# Patient Record
Sex: Male | Born: 1995 | Race: Black or African American | Hispanic: No | Marital: Single | Smoking: Never smoker
Health system: Southern US, Community
[De-identification: ages and names within clinical notes are randomized; demographics above are authoritative.]

## PROBLEM LIST (undated history)

## (undated) ENCOUNTER — Ambulatory Visit: Payer: Self-pay

---

## 2017-08-19 ENCOUNTER — Emergency Department (HOSPITAL_COMMUNITY): Payer: Worker's Compensation

## 2017-08-19 ENCOUNTER — Emergency Department (HOSPITAL_COMMUNITY)
Admission: EM | Admit: 2017-08-19 | Discharge: 2017-08-19 | Disposition: A | Discharge status: Home / Self Care | Payer: Worker's Compensation | Attending: Emergency Medicine | Admitting: Emergency Medicine

## 2017-08-19 ENCOUNTER — Other Ambulatory Visit: Payer: Self-pay

## 2017-08-19 DIAGNOSIS — S0990XA Unspecified injury of head, initial encounter: Secondary | ICD-10-CM | POA: Diagnosis not present

## 2017-08-19 DIAGNOSIS — M549 Dorsalgia, unspecified: Secondary | ICD-10-CM | POA: Insufficient documentation

## 2017-08-19 DIAGNOSIS — M545 Low back pain: Secondary | ICD-10-CM | POA: Diagnosis not present

## 2017-08-19 DIAGNOSIS — S199XXA Unspecified injury of neck, initial encounter: Secondary | ICD-10-CM | POA: Diagnosis not present

## 2017-08-19 DIAGNOSIS — Y998 Other external cause status: Secondary | ICD-10-CM | POA: Diagnosis not present

## 2017-08-19 DIAGNOSIS — Y9389 Activity, other specified: Secondary | ICD-10-CM | POA: Insufficient documentation

## 2017-08-19 DIAGNOSIS — S161XXA Strain of muscle, fascia and tendon at neck level, initial encounter: Secondary | ICD-10-CM | POA: Insufficient documentation

## 2017-08-19 DIAGNOSIS — G44209 Tension-type headache, unspecified, not intractable: Secondary | ICD-10-CM

## 2017-08-19 DIAGNOSIS — R079 Chest pain, unspecified: Secondary | ICD-10-CM | POA: Diagnosis not present

## 2017-08-19 DIAGNOSIS — S299XXA Unspecified injury of thorax, initial encounter: Secondary | ICD-10-CM | POA: Diagnosis not present

## 2017-08-19 DIAGNOSIS — S3992XA Unspecified injury of lower back, initial encounter: Secondary | ICD-10-CM | POA: Diagnosis not present

## 2017-08-19 DIAGNOSIS — Y92410 Unspecified street and highway as the place of occurrence of the external cause: Secondary | ICD-10-CM | POA: Insufficient documentation

## 2017-08-19 DIAGNOSIS — M546 Pain in thoracic spine: Secondary | ICD-10-CM | POA: Diagnosis not present

## 2017-08-19 DIAGNOSIS — R0781 Pleurodynia: Secondary | ICD-10-CM | POA: Diagnosis not present

## 2017-08-19 DIAGNOSIS — R51 Headache: Secondary | ICD-10-CM | POA: Diagnosis not present

## 2017-08-19 DIAGNOSIS — R001 Bradycardia, unspecified: Secondary | ICD-10-CM | POA: Diagnosis not present

## 2017-08-19 MED ORDER — NAPROXEN 500 MG PO TABS: 500.0000 mg | ORAL_TABLET | Freq: Two times a day (BID) | ORAL | 0 refills | Status: AC

## 2017-08-19 MED ORDER — METHOCARBAMOL 500 MG PO TABS: 500.0000 mg | ORAL_TABLET | Freq: Two times a day (BID) | ORAL | 0 refills | Status: DC

## 2017-08-19 NOTE — ED Notes (Signed)
ED Provider at bedside. 

## 2017-08-19 NOTE — Discharge Instructions (Signed)
Your work-up in the emergency department today was reassuring.  Alternate ice and heat to areas of injury 3-4 times per day to limit inflammation and spasm.  Avoid strenuous activity and heavy lifting.  We recommend consistent use of naproxen in addition to Robaxin for muscle spasms.  Do not drive or drink alcohol after taking Robaxin as it may make you drowsy and impair your judgment.  We recommend follow-up with a primary care doctor to ensure resolution of symptoms.  Return to the ED for any new or concerning symptoms.
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
## 2017-08-21 ENCOUNTER — Emergency Department (HOSPITAL_COMMUNITY)
Admission: EM | Admit: 2017-08-21 | Discharge: 2017-08-21 | Disposition: A | Discharge status: Home / Self Care | Payer: BLUE CROSS/BLUE SHIELD | Attending: Emergency Medicine | Admitting: Emergency Medicine

## 2017-08-21 ENCOUNTER — Encounter (HOSPITAL_COMMUNITY): Payer: Self-pay | Admitting: *Deleted

## 2017-08-21 ENCOUNTER — Other Ambulatory Visit: Payer: Self-pay

## 2017-08-21 DIAGNOSIS — Z041 Encounter for examination and observation following transport accident: Secondary | ICD-10-CM | POA: Insufficient documentation

## 2017-08-21 DIAGNOSIS — M545 Low back pain: Secondary | ICD-10-CM | POA: Diagnosis not present

## 2017-08-21 NOTE — ED Notes (Signed)
Declined W/C at D/C and was escorted to lobby by RN. 
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
## 2017-08-21 NOTE — Discharge Instructions (Addendum)
Use heat/ice or IcyHot on your back for comfort.  Avoid taking Naproxen as it may have upset your stomach.  Take tylenol instead.

## 2018-02-27 DIAGNOSIS — E559 Vitamin D deficiency, unspecified: Secondary | ICD-10-CM | POA: Diagnosis not present

## 2018-02-27 DIAGNOSIS — R202 Paresthesia of skin: Secondary | ICD-10-CM | POA: Diagnosis not present

## 2018-02-27 DIAGNOSIS — E079 Disorder of thyroid, unspecified: Secondary | ICD-10-CM | POA: Diagnosis not present

## 2018-02-27 DIAGNOSIS — E785 Hyperlipidemia, unspecified: Secondary | ICD-10-CM | POA: Diagnosis not present

## 2018-02-27 DIAGNOSIS — Z681 Body mass index (BMI) 19 or less, adult: Secondary | ICD-10-CM | POA: Diagnosis not present

## 2018-02-27 DIAGNOSIS — D649 Anemia, unspecified: Secondary | ICD-10-CM | POA: Diagnosis not present

## 2018-02-27 DIAGNOSIS — J309 Allergic rhinitis, unspecified: Secondary | ICD-10-CM | POA: Diagnosis not present

## 2018-03-05 DIAGNOSIS — E559 Vitamin D deficiency, unspecified: Secondary | ICD-10-CM | POA: Diagnosis not present

## 2018-09-30 DIAGNOSIS — R112 Nausea with vomiting, unspecified: Secondary | ICD-10-CM | POA: Diagnosis not present

## 2018-10-07 DIAGNOSIS — Z202 Contact with and (suspected) exposure to infections with a predominantly sexual mode of transmission: Secondary | ICD-10-CM | POA: Diagnosis not present

## 2018-11-05 DIAGNOSIS — J019 Acute sinusitis, unspecified: Secondary | ICD-10-CM | POA: Diagnosis not present

## 2019-07-05 DIAGNOSIS — Z20828 Contact with and (suspected) exposure to other viral communicable diseases: Secondary | ICD-10-CM | POA: Diagnosis not present

## 2019-07-05 DIAGNOSIS — Z03818 Encounter for observation for suspected exposure to other biological agents ruled out: Secondary | ICD-10-CM | POA: Diagnosis not present

## 2020-01-14 DIAGNOSIS — R52 Pain, unspecified: Secondary | ICD-10-CM | POA: Diagnosis not present

## 2020-01-14 DIAGNOSIS — U071 COVID-19: Secondary | ICD-10-CM | POA: Diagnosis not present

## 2020-01-14 DIAGNOSIS — J029 Acute pharyngitis, unspecified: Secondary | ICD-10-CM | POA: Diagnosis not present

## 2020-01-14 DIAGNOSIS — R509 Fever, unspecified: Secondary | ICD-10-CM | POA: Diagnosis not present

## 2020-01-14 DIAGNOSIS — R059 Cough, unspecified: Secondary | ICD-10-CM | POA: Diagnosis not present

## 2020-01-23 DIAGNOSIS — U071 COVID-19: Secondary | ICD-10-CM | POA: Diagnosis not present

## 2020-01-23 DIAGNOSIS — Z20822 Contact with and (suspected) exposure to covid-19: Secondary | ICD-10-CM | POA: Diagnosis not present

## 2020-01-25 DIAGNOSIS — Z03818 Encounter for observation for suspected exposure to other biological agents ruled out: Secondary | ICD-10-CM | POA: Diagnosis not present

## 2020-01-25 DIAGNOSIS — Z20822 Contact with and (suspected) exposure to covid-19: Secondary | ICD-10-CM | POA: Diagnosis not present
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
## 2020-04-11 DIAGNOSIS — Z20822 Contact with and (suspected) exposure to covid-19: Secondary | ICD-10-CM | POA: Diagnosis not present

## 2020-04-11 DIAGNOSIS — Z03818 Encounter for observation for suspected exposure to other biological agents ruled out: Secondary | ICD-10-CM | POA: Diagnosis not present

## 2020-04-14 DIAGNOSIS — M791 Myalgia, unspecified site: Secondary | ICD-10-CM | POA: Diagnosis not present

## 2020-04-14 DIAGNOSIS — Z20822 Contact with and (suspected) exposure to covid-19: Secondary | ICD-10-CM | POA: Diagnosis not present

## 2020-04-14 DIAGNOSIS — R5383 Other fatigue: Secondary | ICD-10-CM | POA: Diagnosis not present

## 2020-04-14 DIAGNOSIS — R519 Headache, unspecified: Secondary | ICD-10-CM | POA: Diagnosis not present

## 2020-04-14 DIAGNOSIS — B349 Viral infection, unspecified: Secondary | ICD-10-CM | POA: Diagnosis not present

## 2020-06-05 DIAGNOSIS — J029 Acute pharyngitis, unspecified: Secondary | ICD-10-CM | POA: Diagnosis not present

## 2020-07-28 DIAGNOSIS — Z20822 Contact with and (suspected) exposure to covid-19: Secondary | ICD-10-CM | POA: Diagnosis not present

## 2020-07-28 DIAGNOSIS — R52 Pain, unspecified: Secondary | ICD-10-CM | POA: Diagnosis not present

## 2020-07-28 DIAGNOSIS — R509 Fever, unspecified: Secondary | ICD-10-CM | POA: Diagnosis not present

## 2022-04-09 ENCOUNTER — Encounter (HOSPITAL_BASED_OUTPATIENT_CLINIC_OR_DEPARTMENT_OTHER): Payer: Self-pay

## 2022-04-09 ENCOUNTER — Emergency Department (HOSPITAL_BASED_OUTPATIENT_CLINIC_OR_DEPARTMENT_OTHER)
Admission: EM | Admit: 2022-04-09 | Discharge: 2022-04-10 | Disposition: A | Discharge status: Home / Self Care | Payer: BLUE CROSS/BLUE SHIELD | Attending: Emergency Medicine | Admitting: Emergency Medicine

## 2022-04-09 DIAGNOSIS — Z20822 Contact with and (suspected) exposure to covid-19: Secondary | ICD-10-CM | POA: Insufficient documentation

## 2022-04-09 DIAGNOSIS — J101 Influenza due to other identified influenza virus with other respiratory manifestations: Secondary | ICD-10-CM | POA: Insufficient documentation

## 2022-04-09 LAB — RESP PANEL BY RT-PCR (RSV, FLU A&B, COVID)  RVPGX2
Influenza A by PCR: POSITIVE — AB
Influenza B by PCR: NEGATIVE
Resp Syncytial Virus by PCR: NEGATIVE
SARS Coronavirus 2 by RT PCR: NEGATIVE
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
## 2022-04-10 MED ORDER — OSELTAMIVIR PHOSPHATE 75 MG PO CAPS: 75.0000 mg | ORAL_CAPSULE | Freq: Two times a day (BID) | ORAL | 0 refills | Status: AC

## 2022-04-10 NOTE — Discharge Instructions (Signed)
Begin taking Tamiflu as prescribed.  Take over-the-counter medications as needed for relief of symptoms.  Drink plenty of fluids and get plenty of rest.  Follow-up with primary doctor if not improving in the next week.

## 2022-04-10 NOTE — ED Provider Notes (Signed)
  [facility] Provider Note   CSN: [id number] Arrival date & time: 04/09/22  2027     History  Chief Complaint  Patient presents with   Cough    Jeffery Phillips is a 27 y.o. male.  Patient is a 68-year-old male presenting with a 2-day history of bodyaches, cough, congestion, and fever.  Cough has been nonproductive.  He denies any chest pain or difficulty breathing.  He has been taking home medications with little relief.  He denies any ill contacts.  The history is provided by the patient.       Home Medications Prior to Admission medications   Medication Sig Start Date End Date Taking? Authorizing Provider  methocarbamol (ROBAXIN) 500 MG tablet Take 1 tablet (500 mg total) by mouth 2 (two) times daily. 08/19/17   Antonietta Breach, PA-C  naproxen (NAPROSYN) 500 MG tablet Take 1 tablet (500 mg total) by mouth 2 (two) times daily. 08/19/17   Antonietta Breach, PA-C      Allergies    Patient has no known allergies.    Review of Systems   Review of Systems  All other systems reviewed and are negative.   Physical Exam Updated Vital Signs BP 129/85   Pulse 88   Temp 98.9 F (37.2 C) (Oral)   Resp 18   SpO2 99%  Physical Exam Vitals and nursing note reviewed.  Constitutional:      General: He is not in acute distress.    Appearance: He is well-developed. He is not diaphoretic.  HENT:     Head: Normocephalic and atraumatic.  Cardiovascular:     Rate and Rhythm: Normal rate and regular rhythm.     Heart sounds: No murmur heard.    No friction rub.  Pulmonary:     Effort: Pulmonary effort is normal. No respiratory distress.     Breath sounds: Normal breath sounds. No wheezing or rales.  Abdominal:     General: Bowel sounds are normal. There is no distension.     Palpations: Abdomen is soft.     Tenderness: There is no abdominal tenderness.  Musculoskeletal:        General: Normal range of motion.     Cervical back: Normal range  of motion and neck supple.  Skin:    General: Skin is warm and dry.  Neurological:     Mental Status: He is alert and oriented to person, place, and time.     Coordination: Coordination normal.     ED Results / Procedures / Treatments   Labs (all labs ordered are listed, but only abnormal results are displayed) Labs Reviewed  RESP PANEL BY RT-PCR (RSV, FLU A&B, COVID)  RVPGX2 - Abnormal; Notable for the following components:      Result Value   Influenza A by PCR POSITIVE (*)    All other components within normal limits    EKG None  Radiology No results found.  Procedures Procedures    Medications Ordered in ED Medications - No data to display  ED Course/ Medical Decision Making/ A&P  Influenza test is positive.  Patient request Tamiflu.  He is to continue over-the-counter medications and follow-up as needed.  Final Clinical Impression(s) / ED Diagnoses Final diagnoses:  None    Rx / DC Orders ED Discharge Orders     None         Veryl Speak, MD 04/10/22 [phone]

## 2022-04-10 NOTE — ED Notes (Signed)
Pt verbalized understanding of d/c instructions, meds, and followup care. Denies questions. VSS, no distress noted. Steady gait to exit with all belongings. 

## 2023-10-25 ENCOUNTER — Emergency Department (HOSPITAL_COMMUNITY)

## 2023-10-25 ENCOUNTER — Emergency Department (HOSPITAL_COMMUNITY)
Admission: EM | Admit: 2023-10-25 | Discharge: 2023-10-26 | Disposition: A | Discharge status: Home / Self Care | Attending: Emergency Medicine | Admitting: Emergency Medicine

## 2023-10-25 ENCOUNTER — Other Ambulatory Visit: Payer: Self-pay

## 2023-10-25 DIAGNOSIS — T148XXA Other injury of unspecified body region, initial encounter: Secondary | ICD-10-CM

## 2023-10-25 DIAGNOSIS — S1181XA Laceration without foreign body of other specified part of neck, initial encounter: Secondary | ICD-10-CM | POA: Diagnosis present

## 2023-10-25 DIAGNOSIS — S81832A Puncture wound without foreign body, left lower leg, initial encounter: Secondary | ICD-10-CM | POA: Diagnosis not present

## 2023-10-25 DIAGNOSIS — S1191XA Laceration without foreign body of unspecified part of neck, initial encounter: Secondary | ICD-10-CM

## 2023-10-25 DIAGNOSIS — Y92410 Unspecified street and highway as the place of occurrence of the external cause: Secondary | ICD-10-CM | POA: Diagnosis not present

## 2023-10-25 LAB — I-STAT CG4 LACTIC ACID, ED: Lactic Acid, Venous: 1.3 mmol/L (ref 0.5–1.9)

## 2023-10-25 LAB — I-STAT CHEM 8, ED
BUN: 14 mg/dL (ref 6–20)
Calcium, Ion: 1.15 mmol/L (ref 1.15–1.40)
Chloride: 104 mmol/L (ref 98–111)
Creatinine, Ser: 1.1 mg/dL (ref 0.61–1.24)
Glucose, Bld: 102 mg/dL — ABNORMAL HIGH (ref 70–99)
HCT: 45 % (ref 39.0–52.0)
Hemoglobin: 15.3 g/dL (ref 13.0–17.0)
Potassium: 3.8 mmol/L (ref 3.5–5.1)
Sodium: 140 mmol/L (ref 135–145)
TCO2: 23 mmol/L (ref 22–32)

## 2023-10-25 MED ORDER — FENTANYL CITRATE PF 50 MCG/ML IJ SOSY
PREFILLED_SYRINGE | INTRAMUSCULAR | Status: DC | PRN
  Administered 2023-10-25: 50 ug via INTRAVENOUS

## 2023-10-25 MED ORDER — LIDOCAINE HCL (PF) 1 % IJ SOLN
5.0000 mL | Freq: Once | INTRAMUSCULAR | Status: AC
  Administered 2023-10-25: 5 mL
  Filled 2023-10-25: qty 5

## 2023-10-25 MED ORDER — CEFAZOLIN SODIUM-DEXTROSE 2-4 GM/100ML-% IV SOLN
2.0000 g | Freq: Once | INTRAVENOUS | Status: AC
  Administered 2023-10-25: 2 g via INTRAVENOUS

## 2023-10-25 MED ORDER — METHOCARBAMOL 500 MG PO TABS: 500.0000 mg | ORAL_TABLET | Freq: Two times a day (BID) | ORAL | 0 refills | Status: AC | PRN

## 2023-10-25 MED ORDER — OXYCODONE-ACETAMINOPHEN 5-325 MG PO TABS
1.0000 | ORAL_TABLET | Freq: Once | ORAL | Status: AC
  Administered 2023-10-25: 1 via ORAL
  Filled 2023-10-25: qty 1

## 2023-10-25 MED ORDER — FENTANYL CITRATE PF 50 MCG/ML IJ SOSY
PREFILLED_SYRINGE | INTRAMUSCULAR | Status: AC
  Administered 2023-10-25: 50 ug via INTRAVENOUS
  Filled 2023-10-25: qty 1

## 2023-10-25 MED ORDER — OXYCODONE-ACETAMINOPHEN 5-325 MG PO TABS: 1.0000 | ORAL_TABLET | Freq: Four times a day (QID) | ORAL | 0 refills | Status: AC | PRN

## 2023-10-25 MED ORDER — IBUPROFEN 600 MG PO TABS: 600.0000 mg | ORAL_TABLET | Freq: Four times a day (QID) | ORAL | 0 refills | Status: AC | PRN

## 2023-10-25 MED ORDER — IOHEXOL 350 MG/ML SOLN
75.0000 mL | Freq: Once | INTRAVENOUS | Status: AC | PRN
  Administered 2023-10-25: 75 mL via INTRAVENOUS
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
## 2024-01-01 ENCOUNTER — Ambulatory Visit: Admitting: Internal Medicine
